# Patient Record
Sex: Female | Born: 1995 | Race: Black or African American | Hispanic: No | Marital: Single | Smoking: Never smoker
Health system: Southern US, Community
[De-identification: ages and names within clinical notes are randomized; demographics above are authoritative.]

---

## 2017-02-02 ENCOUNTER — Emergency Department (HOSPITAL_COMMUNITY): Payer: Self-pay

## 2017-02-02 ENCOUNTER — Emergency Department (HOSPITAL_COMMUNITY)
Admission: EM | Admit: 2017-02-02 | Discharge: 2017-02-02 | Disposition: A | Discharge status: Home / Self Care | Payer: Self-pay | Attending: Emergency Medicine | Admitting: Emergency Medicine

## 2017-02-02 ENCOUNTER — Encounter (HOSPITAL_COMMUNITY): Payer: Self-pay | Admitting: *Deleted

## 2017-02-02 DIAGNOSIS — H5713 Ocular pain, bilateral: Secondary | ICD-10-CM | POA: Insufficient documentation

## 2017-02-02 DIAGNOSIS — Y9289 Other specified places as the place of occurrence of the external cause: Secondary | ICD-10-CM | POA: Insufficient documentation

## 2017-02-02 DIAGNOSIS — S0990XA Unspecified injury of head, initial encounter: Secondary | ICD-10-CM

## 2017-02-02 DIAGNOSIS — S0181XA Laceration without foreign body of other part of head, initial encounter: Secondary | ICD-10-CM | POA: Insufficient documentation

## 2017-02-02 DIAGNOSIS — Y999 Unspecified external cause status: Secondary | ICD-10-CM | POA: Insufficient documentation

## 2017-02-02 DIAGNOSIS — Y939 Activity, unspecified: Secondary | ICD-10-CM | POA: Insufficient documentation

## 2017-02-02 MED ORDER — LIDOCAINE HCL (PF) 1 % IJ SOLN
5.0000 mL | Freq: Once | INTRAMUSCULAR | Status: AC
  Administered 2017-02-02: 5 mL via INTRADERMAL
  Filled 2017-02-02: qty 5

## 2017-02-02 MED ORDER — ACETAMINOPHEN 500 MG PO TABS
1000.0000 mg | ORAL_TABLET | Freq: Once | ORAL | Status: AC
  Administered 2017-02-02: 1000 mg via ORAL
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
## 2017-02-02 NOTE — ED Notes (Signed)
Pt alert no distress 
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
## 2017-02-02 NOTE — ED Notes (Signed)
To ct
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
